# Patient Record
Sex: Male | Born: 2000 | Race: Black or African American | Hispanic: No | Marital: Single
Health system: Southern US, Community
[De-identification: ages and names within clinical notes are randomized; demographics above are authoritative.]

## PROBLEM LIST (undated history)

## (undated) DIAGNOSIS — J45909 Unspecified asthma, uncomplicated: Secondary | ICD-10-CM

---
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
## 2019-12-27 ENCOUNTER — Other Ambulatory Visit: Payer: Medicaid Other

## 2019-12-27 ENCOUNTER — Other Ambulatory Visit: Payer: Self-pay

## 2019-12-27 DIAGNOSIS — Z20822 Contact with and (suspected) exposure to covid-19: Secondary | ICD-10-CM

## 2019-12-29 LAB — NOVEL CORONAVIRUS, NAA: SARS-CoV-2, NAA: NOT DETECTED

## 2019-12-29 LAB — SARS-COV-2, NAA 2 DAY TAT

## 2021-05-01 ENCOUNTER — Other Ambulatory Visit: Payer: Self-pay

## 2021-05-01 ENCOUNTER — Emergency Department (HOSPITAL_COMMUNITY)
Admission: EM | Admit: 2021-05-01 | Discharge: 2021-05-01 | Disposition: A | Discharge status: Home / Self Care | Payer: 59 | Attending: Emergency Medicine | Admitting: Emergency Medicine

## 2021-05-01 DIAGNOSIS — Z041 Encounter for examination and observation following transport accident: Secondary | ICD-10-CM | POA: Diagnosis present

## 2021-05-01 DIAGNOSIS — Y92410 Unspecified street and highway as the place of occurrence of the external cause: Secondary | ICD-10-CM | POA: Diagnosis not present
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
## 2021-05-01 NOTE — Discharge Instructions (Addendum)
It was a pleasure taking care of you today!   You will feel more sore in the morning.  If you begin to feel more sore you may take over-the-counter 600 mg ibuprofen every 6 hours or 1000 mg Tylenol every 6 hours as needed for pain. You may apply ice or heat to affected area for up to 15 minutes at a time.  Ensure to place a barrier between your skin and the ice/heat.  Return to the Emergency Department if you are experiencing increasing/worsening chest pain, trouble breathing, worsening symptoms.

## 2022-09-25 ENCOUNTER — Other Ambulatory Visit: Payer: Self-pay

## 2022-09-25 ENCOUNTER — Emergency Department (HOSPITAL_BASED_OUTPATIENT_CLINIC_OR_DEPARTMENT_OTHER): Payer: Medicaid Other

## 2022-09-25 ENCOUNTER — Emergency Department (HOSPITAL_BASED_OUTPATIENT_CLINIC_OR_DEPARTMENT_OTHER)
Admission: EM | Admit: 2022-09-25 | Discharge: 2022-09-25 | Disposition: A | Discharge status: Home / Self Care | Payer: Medicaid Other | Attending: Emergency Medicine | Admitting: Emergency Medicine

## 2022-09-25 ENCOUNTER — Encounter (HOSPITAL_BASED_OUTPATIENT_CLINIC_OR_DEPARTMENT_OTHER): Payer: Self-pay

## 2022-09-25 DIAGNOSIS — T148XXA Other injury of unspecified body region, initial encounter: Secondary | ICD-10-CM

## 2022-09-25 DIAGNOSIS — W208XXA Other cause of strike by thrown, projected or falling object, initial encounter: Secondary | ICD-10-CM | POA: Diagnosis not present

## 2022-09-25 DIAGNOSIS — S91311A Laceration without foreign body, right foot, initial encounter: Secondary | ICD-10-CM | POA: Insufficient documentation

## 2022-09-25 DIAGNOSIS — M79671 Pain in right foot: Secondary | ICD-10-CM | POA: Diagnosis present

## 2022-09-25 DIAGNOSIS — Z23 Encounter for immunization: Secondary | ICD-10-CM | POA: Insufficient documentation

## 2022-09-25 HISTORY — DX: Unspecified asthma, uncomplicated: J45.909

## 2022-09-25 MED ORDER — IBUPROFEN 400 MG PO TABS
400.0000 mg | ORAL_TABLET | Freq: Once | ORAL | Status: AC
  Administered 2022-09-25: 400 mg via ORAL
  Filled 2022-09-25: qty 1

## 2022-09-25 MED ORDER — ACETAMINOPHEN 500 MG PO TABS
1000.0000 mg | ORAL_TABLET | Freq: Once | ORAL | Status: AC
Start: 2022-09-25 — End: 2022-09-25
  Administered 2022-09-25: 1000 mg via ORAL
  Filled 2022-09-25: qty 2

## 2022-09-25 MED ORDER — TETANUS-DIPHTH-ACELL PERTUSSIS 5-2.5-18.5 LF-MCG/0.5 IM SUSY
0.5000 mL | PREFILLED_SYRINGE | Freq: Once | INTRAMUSCULAR | Status: AC
  Administered 2022-09-25: 0.5 mL via INTRAMUSCULAR
  Filled 2022-09-25: qty 0.5

## 2022-09-25 MED ORDER — IBUPROFEN 400 MG PO TABS: 400.0000 mg | ORAL_TABLET | Freq: Four times a day (QID) | ORAL | 0 refills | Status: AC | PRN

## 2022-09-25 NOTE — ED Provider Notes (Signed)
[facility] Provider Note   CSN: [id number] Arrival date & time: 09/25/22  4098     History  Chief Complaint  Patient presents with   Foot Pain    Jeremy Pittman is a 22 y.o. male.  The history is provided by the patient.  Foot Pain This is a new problem. The current episode started 3 to 5 hours ago. The problem occurs constantly. The problem has not changed since onset.Pertinent negatives include no chest pain, no abdominal pain, no headaches and no shortness of breath. Nothing aggravates the symptoms. Nothing relieves the symptoms. He has tried nothing for the symptoms. The treatment provided no relief.  Dropped a heavy box on Right foot this evening.       Home Medications Prior to Admission medications   Medication Sig Start Date End Date Taking? Authorizing Provider  ibuprofen (ADVIL) 400 MG tablet Take 1 tablet (400 mg total) by mouth every 6 (six) hours as needed. 09/25/22  Yes Eros Montour, MD      Allergies    No known allergies    Review of Systems   Review of Systems  Constitutional:  Negative for fever.  HENT:  Negative for facial swelling.   Eyes:  Negative for redness.  Respiratory:  Negative for shortness of breath.   Cardiovascular:  Negative for chest pain.  Gastrointestinal:  Negative for abdominal pain.  Musculoskeletal:  Positive for arthralgias.  Neurological:  Negative for headaches.  All other systems reviewed and are negative.   Physical Exam Updated Vital Signs BP (!) 140/77 (BP Location: Right Arm)   Pulse (!) 53   Temp 98.1 F (36.7 C) (Oral)   Resp 20   SpO2 96%  Physical Exam Vitals and nursing note reviewed. Exam conducted with a chaperone present.  Constitutional:      General: He is not in acute distress.    Appearance: Normal appearance. He is well-developed. He is not diaphoretic.  HENT:     Head: Normocephalic and atraumatic.     Nose: Nose normal.  Eyes:     Conjunctiva/sclera:  Conjunctivae normal.     Pupils: Pupils are equal, round, and reactive to light.  Cardiovascular:     Rate and Rhythm: Normal rate and regular rhythm.  Pulmonary:     Effort: Pulmonary effort is normal.     Breath sounds: Normal breath sounds. No wheezing or rales.  Abdominal:     General: Bowel sounds are normal.     Palpations: Abdomen is soft.     Tenderness: There is no abdominal tenderness. There is no guarding or rebound.  Musculoskeletal:        General: Normal range of motion.     Cervical back: Normal range of motion and neck supple.     Right ankle: Normal.     Right Achilles Tendon: Normal.     Right foot: Normal range of motion and normal capillary refill. Laceration present. No swelling, deformity, bunion, Charcot foot, foot drop, prominent metatarsal heads, bony tenderness or crepitus. Normal pulse.       Legs:  Skin:    General: Skin is warm and dry.     Capillary Refill: Capillary refill takes less than 2 seconds.  Neurological:     Mental Status: He is alert and oriented to person, place, and time.     ED Results / Procedures / Treatments   Labs (all labs ordered are listed, but only abnormal results are  displayed) Labs Reviewed - No data to display  EKG None  Radiology DG Foot Complete Right  Result Date: 09/25/2022 CLINICAL DATA:  Dropped box on second toe of right foot. EXAM: RIGHT FOOT COMPLETE - 3+ VIEW COMPARISON:  None Available. FINDINGS: There is no evidence of fracture or dislocation. Hardware is identified in the anterior calcaneus. Soft tissue swelling is noted at the second digit. Evaluation is limited due to overlying bandage material. IMPRESSION: No acute fracture or dislocation. Electronically Signed   By: Thornell Sartorius M.D.   On: 09/25/2022 04:25    Procedures Procedures    Medications Ordered in ED Medications  Tdap (BOOSTRIX) injection 0.5 mL (0.5 mLs Intramuscular Given 09/25/22 0346)  acetaminophen (TYLENOL) tablet 1,000 mg (1,000 mg  Oral Given 09/25/22 0346)  ibuprofen (ADVIL) tablet 400 mg (400 mg Oral Given 09/25/22 0346)    ED Course/ Medical Decision Making/ A&P                             Medical Decision Making Foot pain since dropping a box on the R foot   Problems Addressed: Abrasion:    Details: Wound care provided in the ED  Amount and/or Complexity of Data Reviewed Independent Historian: parent    Details: See above  External Data Reviewed: notes.    Details: Previous notes reviewed  Radiology: ordered and independent interpretation performed.    Details: No fracture by me   Risk OTC drugs. Prescription drug management. Risk Details: Wound care with cushioning and buddy taping and tylenol and ibuprofen in the ED.  Ice, elevation and NSAIDs.  Stable for discharge.  Strict return.      Final Clinical Impression(s) / ED Diagnoses Final diagnoses:  Foot pain, right  Abrasion   Return for intractable cough, coughing up blood, fevers > 100.4 unrelieved by medication, shortness of breath, intractable vomiting, chest pain, shortness of breath, weakness, numbness, changes in speech, facial asymmetry, abdominal pain, passing out, Inability to tolerate liquids or food, cough, altered mental status or any concerns. No signs of systemic illness or infection. The patient is nontoxic-appearing on exam and vital signs are within normal limits.  I have reviewed the triage vital signs and the nursing notes. Pertinent labs & imaging results that were available during my care of the patient were reviewed by me and considered in my medical decision making (see chart for details). After history, exam, and medical workup I feel the patient has been appropriately medically screened and is safe for discharge home. Pertinent diagnoses were discussed with the patient. Patient was given return precautions Rx / DC Orders ED Discharge Orders          Ordered    ibuprofen (ADVIL) 400 MG tablet  Every 6 hours PRN         09/25/22 0441              Chrishawna Farina, MD 09/25/22 0448

## 2022-09-25 NOTE — ED Triage Notes (Signed)
Dropped a box on the second toe on the rt foot around 11 pm. Toe looks swollen and bloody around the nailbed.

## 2022-11-17 ENCOUNTER — Emergency Department (HOSPITAL_BASED_OUTPATIENT_CLINIC_OR_DEPARTMENT_OTHER)
Admission: EM | Admit: 2022-11-17 | Discharge: 2022-11-17 | Disposition: A | Discharge status: Home / Self Care | Payer: Medicaid Other

## 2022-11-17 ENCOUNTER — Encounter (HOSPITAL_BASED_OUTPATIENT_CLINIC_OR_DEPARTMENT_OTHER): Payer: Self-pay | Admitting: Emergency Medicine

## 2022-11-17 DIAGNOSIS — R197 Diarrhea, unspecified: Secondary | ICD-10-CM | POA: Diagnosis present

## 2022-11-17 LAB — MAGNESIUM: Magnesium: 1.8 mg/dL (ref 1.7–2.4)

## 2022-11-17 LAB — CBC WITH DIFFERENTIAL/PLATELET
Abs Immature Granulocytes: 0.01 10*3/uL (ref 0.00–0.07)
Basophils Absolute: 0 10*3/uL (ref 0.0–0.1)
Basophils Relative: 0 %
Eosinophils Absolute: 0.1 10*3/uL (ref 0.0–0.5)
Eosinophils Relative: 2 %
HCT: 43 % (ref 39.0–52.0)
Hemoglobin: 14.2 g/dL (ref 13.0–17.0)
Immature Granulocytes: 0 %
Lymphocytes Relative: 27 %
Lymphs Abs: 1.4 10*3/uL (ref 0.7–4.0)
MCH: 28.1 pg (ref 26.0–34.0)
MCHC: 33 g/dL (ref 30.0–36.0)
MCV: 85.1 fL (ref 80.0–100.0)
Monocytes Absolute: 0.3 10*3/uL (ref 0.1–1.0)
Monocytes Relative: 7 %
Neutro Abs: 3.1 10*3/uL (ref 1.7–7.7)
Neutrophils Relative %: 64 %
Platelets: 192 10*3/uL (ref 150–400)
RBC: 5.05 MIL/uL (ref 4.22–5.81)
RDW: 13.3 % (ref 11.5–15.5)
WBC: 4.9 10*3/uL (ref 4.0–10.5)
nRBC: 0 % (ref 0.0–0.2)

## 2022-11-17 LAB — BASIC METABOLIC PANEL
Anion gap: 10 (ref 5–15)
BUN: 12 mg/dL (ref 6–20)
CO2: 25 mmol/L (ref 22–32)
Calcium: 9.2 mg/dL (ref 8.9–10.3)
Chloride: 100 mmol/L (ref 98–111)
Creatinine, Ser: 1.13 mg/dL (ref 0.61–1.24)
GFR, Estimated: >60 mL/min (ref >60)
Glucose, Bld: 97 mg/dL (ref 70–99)
Potassium: 3.6 mmol/L (ref 3.5–5.1)
Sodium: 135 mmol/L (ref 135–145)

## 2022-11-17 NOTE — ED Triage Notes (Signed)
Pt here from home with c/o loose stools times 1 month , pt thinks its from eating a lot of fast food , no abd pain or n/v

## 2022-11-17 NOTE — Discharge Instructions (Addendum)
Your labs are reassuring.  No electrolyte abnormality.  Follow-up with your primary care provider and gastroenterologist.  Information with gastroenterologist listed above.  Increase fiber in your diet.

## 2022-11-17 NOTE — ED Provider Notes (Signed)
[facility] Provider Note   CSN: [id number] Arrival date & time: 11/17/22  1352     History  Chief Complaint  Patient presents with   Diarrhea    Jeremy Pittman is a 22 y.o. male.  22 year old male presents today for concern of diarrhea for 1 month.  He states occasionally he gets sensation of fullness and gassiness which improves after defecating.  He states certain foods cause tenesmus.  No abdominal pain, fever, blood in stool, nausea, or vomiting.  He does eat lots of fast food given his work schedule.  He has about a couple episodes per day.  He states they are not watery but are just loose.  The history is provided by the patient. No language interpreter was used.       Home Medications Prior to Admission medications   Medication Sig Start Date End Date Taking? Authorizing Provider  ibuprofen (ADVIL) 400 MG tablet Take 1 tablet (400 mg total) by mouth every 6 (six) hours as needed. 09/25/22   Palumbo, April, MD      Allergies    No known allergies    Review of Systems   Review of Systems  Constitutional:  Negative for fever.  Respiratory:  Negative for shortness of breath.   Gastrointestinal:  Positive for diarrhea. Negative for abdominal pain, nausea and vomiting.  Neurological:  Negative for light-headedness.  All other systems reviewed and are negative.   Physical Exam Updated Vital Signs BP (!) 159/89 (BP Location: Left Arm)   Pulse 90   Temp 97.7 F (36.5 C)   Resp 18   Ht 5\' 9"  (1.753 m)   Wt 90.7 kg   SpO2 100%   BMI 29.53 kg/m  Physical Exam Vitals and nursing note reviewed.  Constitutional:      General: He is not in acute distress.    Appearance: Normal appearance. He is not ill-appearing.  HENT:     Head: Normocephalic and atraumatic.     Nose: Nose normal.  Eyes:     Conjunctiva/sclera: Conjunctivae normal.  Cardiovascular:     Rate and Rhythm: Normal rate.  Pulmonary:     Effort: Pulmonary  effort is normal. No respiratory distress.     Breath sounds: Normal breath sounds.  Abdominal:     General: There is no distension.     Palpations: Abdomen is soft.     Tenderness: There is no abdominal tenderness. There is no guarding.  Musculoskeletal:        General: No deformity. Normal range of motion.     Cervical back: Normal range of motion.  Skin:    Findings: No rash.  Neurological:     General: No focal deficit present.     Mental Status: He is alert.     ED Results / Procedures / Treatments   Labs (all labs ordered are listed, but only abnormal results are displayed) Labs Reviewed  CBC WITH DIFFERENTIAL/PLATELET  BASIC METABOLIC PANEL  MAGNESIUM    EKG None  Radiology No results found.  Procedures Procedures    Medications Ordered in ED Medications - No data to display  ED Course/ Medical Decision Making/ A&P Clinical Course as of 11/17/22 1512  Tue Nov 17, 2022  1507 GI follow up. [CG]    Clinical Course User Index [CG] Al Decant, PA-C  Medical Decision Making Amount and/or Complexity of Data Reviewed Labs: ordered.   22 year old male presents today for concern of diarrhea ongoing for about a month.  He is having a couple episodes per day.  Worse with certain foods.  No associated symptoms such as blood in stool, abdominal pain, nausea or vomiting.  No fever.  Will obtain basic blood work.  At the end of my shift signed out to her to follow-up on labs. Discussed increasing fiber in his diet.  Final Clinical Impression(s) / ED Diagnoses Final diagnoses:  Diarrhea, unspecified type    Rx / DC Orders ED Discharge Orders     None         Marita Kansas, PA-C 11/17/22 1518    Coral Spikes, DO 11/18/22 [phone]

## 2023-07-16 ENCOUNTER — Emergency Department (HOSPITAL_BASED_OUTPATIENT_CLINIC_OR_DEPARTMENT_OTHER)
Admission: EM | Admit: 2023-07-16 | Discharge: 2023-07-16 | Disposition: A | Discharge status: Home / Self Care | Attending: Emergency Medicine | Admitting: Emergency Medicine

## 2023-07-16 ENCOUNTER — Encounter (HOSPITAL_BASED_OUTPATIENT_CLINIC_OR_DEPARTMENT_OTHER): Payer: Self-pay | Admitting: Emergency Medicine

## 2023-07-16 ENCOUNTER — Other Ambulatory Visit: Payer: Self-pay

## 2023-07-16 DIAGNOSIS — Y990 Civilian activity done for income or pay: Secondary | ICD-10-CM | POA: Insufficient documentation

## 2023-07-16 DIAGNOSIS — M545 Low back pain, unspecified: Secondary | ICD-10-CM | POA: Diagnosis present

## 2023-07-16 DIAGNOSIS — X500XXA Overexertion from strenuous movement or load, initial encounter: Secondary | ICD-10-CM | POA: Diagnosis not present

## 2023-07-16 LAB — URINALYSIS, ROUTINE W REFLEX MICROSCOPIC
Bilirubin Urine: NEGATIVE
Glucose, UA: NEGATIVE mg/dL
Ketones, ur: NEGATIVE mg/dL
Leukocytes,Ua: NEGATIVE
Nitrite: NEGATIVE
Protein, ur: NEGATIVE mg/dL
Specific Gravity, Urine: 1.02 (ref 1.005–1.030)
pH: 8 (ref 5.0–8.0)

## 2023-07-16 LAB — URINALYSIS, MICROSCOPIC (REFLEX)

## 2023-07-16 MED ORDER — LIDOCAINE 5 % EX OINT
1.0000 | TOPICAL_OINTMENT | CUTANEOUS | 0 refills | Status: AC | PRN
Start: 2023-07-16 — End: ?

## 2023-07-16 MED ORDER — METAXALONE 800 MG PO TABS
800.0000 mg | ORAL_TABLET | Freq: Three times a day (TID) | ORAL | 0 refills | Status: AC
Start: 2023-07-16 — End: ?

## 2023-07-16 MED ORDER — LIDOCAINE 5 % EX PTCH
2.0000 | MEDICATED_PATCH | CUTANEOUS | Status: DC
  Administered 2023-07-16: 2 via TRANSDERMAL
  Filled 2023-07-16: qty 2

## 2023-07-16 NOTE — ED Triage Notes (Addendum)
 Pt reports low back pain since Wed; reports possible strain at work, denies specific injury or trauma, denies radiation; reports "brownish" urine today, denies dysuria

## 2023-07-16 NOTE — Discharge Instructions (Signed)
 Today you are seen for low back pain.  Please pick up your medications and take as prescribed.  You may also alternate taking Tylenol/Motrin as needed for pain.  You should follow-up with your primary care if your symptoms persist for further evaluation and treatment.  Thank you for letting us treat you today. After warming a physical exam reviewing your labs, I feel you are safe to go home. Please follow up with your PCP in the next several days and provide them with your records from this visit. Return to the Emergency Room if pain becomes severe or symptoms worsen.

## 2023-07-16 NOTE — ED Provider Notes (Signed)
 [place] [facility] AT [facility] Provider Note   CSN: [id number] Arrival date & time: 07/16/23  1414     History  Chief Complaint  Patient presents with   Back Pain    Jeremy Pittman is a 23 y.o. male presents today for low back pain since Wednesday.  Patient states that he works at [employer] and does a lot of lifting.  Patient states that he believes that that could have caused the back pain.  Patient denies any specific injury or trauma.  Patient denies radiation of pain, numbness, tingling, loss of bowel or bladder, saddle anesthesia, or fever.   Back Pain      Home Medications Prior to Admission medications   Medication Sig Start Date End Date Taking? Authorizing Provider  lidocaine (XYLOCAINE) 5 % ointment Apply 1 Application topically as needed. 07/16/23  Yes Dolphus Jenny, PA-C  metaxalone (SKELAXIN) 800 MG tablet Take 1 tablet (800 mg total) by mouth 3 (three) times daily. 07/16/23  Yes Dolphus Jenny, PA-C  ibuprofen (ADVIL) 400 MG tablet Take 1 tablet (400 mg total) by mouth every 6 (six) hours as needed. 09/25/22   Palumbo, April, MD      Allergies    No known allergies    Review of Systems   Review of Systems  Musculoskeletal:  Positive for back pain.    Physical Exam Updated Vital Signs BP 134/72 (BP Location: Left Arm)   Pulse 80   Temp (!) 97.4 F (36.3 C)   Resp 18   Ht 5\' 9"  (1.753 m)   Wt 95.3 kg   SpO2 100%   BMI 31.01 kg/m  Physical Exam Vitals and nursing note reviewed.  Constitutional:      General: He is not in acute distress.    Appearance: Normal appearance. He is well-developed. He is not ill-appearing, toxic-appearing or diaphoretic.  HENT:     Head: Normocephalic and atraumatic.     Right Ear: External ear normal.     Left Ear: External ear normal.     Nose: Nose normal.  Eyes:     Conjunctiva/sclera: Conjunctivae normal.  Cardiovascular:     Rate and Rhythm: Normal rate and regular rhythm.     Pulses: Normal  pulses.     Heart sounds: No murmur heard. Pulmonary:     Effort: Pulmonary effort is normal. No respiratory distress.     Breath sounds: Normal breath sounds.  Abdominal:     Palpations: Abdomen is soft.     Tenderness: There is no abdominal tenderness.  Musculoskeletal:        General: No swelling. Normal range of motion.     Cervical back: Neck supple.     Right lower leg: No edema.     Left lower leg: No edema.     Comments: Patient has minimal tenderness to palpation of the paraspinal muscles in the lumbar region.  Patient denies any bony tenderness, no step-offs or deformities noted.  Skin:    General: Skin is warm and dry.     Capillary Refill: Capillary refill takes less than 2 seconds.  Neurological:     General: No focal deficit present.     Mental Status: He is alert.     Sensory: No sensory deficit.     Motor: No weakness.  Psychiatric:        Mood and Affect: Mood normal.     ED Results / Procedures / Treatments   Labs (all  labs ordered are listed, but only abnormal results are displayed) Labs Reviewed  URINALYSIS, ROUTINE W REFLEX MICROSCOPIC - Abnormal; Notable for the following components:      Result Value   Hgb urine dipstick SMALL (*)    All other components within normal limits  URINALYSIS, MICROSCOPIC (REFLEX) - Abnormal; Notable for the following components:   Bacteria, UA RARE (*)    All other components within normal limits    EKG None  Radiology No results found.  Procedures Procedures    Medications Ordered in ED Medications  lidocaine (LIDODERM) 5 % 2 patch (has no administration in time range)    ED Course/ Medical Decision Making/ A&P                                 Medical Decision Making Amount and/or Complexity of Data Reviewed Labs: ordered.   This patient presents to the ED for concern of back pain differential diagnosis includes UTI, spinal cord injury, cauda equina syndrome, epidural abscess, low back pain   Lab  Tests:  I Ordered, and personally interpreted labs.  The pertinent results include: UA showed small hemoglobin   Medicines ordered and prescription drug management:  I ordered medication including Lidoderm patch for back pain Reevaluation of the patient after these medicines showed that the patient stayed the same I have reviewed the patients home medicines and have made adjustments as needed   Problem List / ED Course:  Low back pain  Consider for mission or further workup however patient's vital signs physical exam are reassuring.  Patient has no red flag signs or symptoms concerning for cauda equina syndrome or epidural abscess.  Patient symptoms likely due to musculoskeletal pain.  Patient given short outpatient course of Skelaxin and Lidoderm patches.  Patient also advised to take Tylenol Motrin as needed for pain.  Patient should follow-up with his primary care if symptoms persist for further evaluation and treatment.        Final Clinical Impression(s) / ED Diagnoses Final diagnoses:  Acute bilateral low back pain without sciatica    Rx / DC Orders ED Discharge Orders          Ordered    metaxalone (SKELAXIN) 800 MG tablet  3 times daily        07/16/23 1758    lidocaine (XYLOCAINE) 5 % ointment  As needed        07/16/23 1758              Dolphus Jenny, PA-C 07/16/23 Flossie Buffy    Vanetta Mulders, MD 07/16/23 [phone]
